# Patient Record
Sex: Male | Born: 1975 | Race: White | Hispanic: No | Marital: Married | Smoking: Never smoker
Health system: Southern US, Community
[De-identification: ages and names within clinical notes are randomized; demographics above are authoritative.]

---

## 2015-03-14 ENCOUNTER — Emergency Department
Admission: EM | Admit: 2015-03-14 | Discharge: 2015-03-14 | Disposition: A | Discharge status: Home / Self Care | Payer: PRIVATE HEALTH INSURANCE | Attending: Emergency Medicine | Admitting: Emergency Medicine

## 2015-03-14 ENCOUNTER — Encounter: Payer: Self-pay | Admitting: *Deleted

## 2015-03-14 ENCOUNTER — Other Ambulatory Visit: Payer: Self-pay

## 2015-03-14 ENCOUNTER — Emergency Department: Payer: PRIVATE HEALTH INSURANCE

## 2015-03-14 DIAGNOSIS — R002 Palpitations: Secondary | ICD-10-CM

## 2015-03-14 DIAGNOSIS — Z880 Allergy status to penicillin: Secondary | ICD-10-CM | POA: Insufficient documentation

## 2015-03-14 DIAGNOSIS — R1013 Epigastric pain: Secondary | ICD-10-CM | POA: Diagnosis not present

## 2015-03-14 DIAGNOSIS — R110 Nausea: Secondary | ICD-10-CM | POA: Insufficient documentation

## 2015-03-14 DIAGNOSIS — Z79899 Other long term (current) drug therapy: Secondary | ICD-10-CM | POA: Diagnosis not present

## 2015-03-14 DIAGNOSIS — R079 Chest pain, unspecified: Secondary | ICD-10-CM | POA: Diagnosis present

## 2015-03-14 LAB — TROPONIN I

## 2015-03-14 LAB — CBC
HCT: 47.6 % (ref 40.0–52.0)
HEMOGLOBIN: 15.9 g/dL (ref 13.0–18.0)
MCH: 31.1 pg (ref 26.0–34.0)
MCHC: 33.4 g/dL (ref 32.0–36.0)
MCV: 92.9 fL (ref 80.0–100.0)
PLATELETS: 193 10*3/uL (ref 150–440)
RBC: 5.12 MIL/uL (ref 4.40–5.90)
RDW: 12.2 % (ref 11.5–14.5)
WBC: 4.7 10*3/uL (ref 3.8–10.6)

## 2015-03-14 LAB — BASIC METABOLIC PANEL
ANION GAP: 8 (ref 5–15)
BUN: 16 mg/dL (ref 6–20)
CHLORIDE: 101 mmol/L (ref 101–111)
CO2: 30 mmol/L (ref 22–32)
CREATININE: 1.14 mg/dL (ref 0.61–1.24)
Calcium: 9.4 mg/dL (ref 8.9–10.3)
GFR calc non Af Amer: >60 mL/min (ref >60)
Glucose, Bld: 104 mg/dL — ABNORMAL HIGH (ref 65–99)
POTASSIUM: 4.1 mmol/L (ref 3.5–5.1)
SODIUM: 139 mmol/L (ref 135–145)

## 2015-03-14 LAB — TSH: TSH: 1.069 u[IU]/mL (ref 0.350–4.500)

## 2015-03-14 MED ORDER — ATENOLOL 50 MG PO TABS
50.0000 mg | ORAL_TABLET | Freq: Every day | ORAL | Status: DC
Start: 2015-03-14 — End: 2015-03-14

## 2015-03-14 NOTE — ED Notes (Signed)
Pt reports " heart fluttering", pt has been treated for hyplori 1 month ago, pt complains of dizziness when heart flutters

## 2015-03-14 NOTE — ED Notes (Signed)
Pt had couplet PVC run HR 48 , strip printed for MD, MD at bedside, pt states he feels nauseas and weak, will continue to monitor closely

## 2015-03-14 NOTE — ED Notes (Signed)
While in room pt stated he had "fluttering feeling", PVC on monitor

## 2015-03-14 NOTE — ED Provider Notes (Signed)
[facility] Emergency Department Provider Note  ____________________________________________  Time seen: Approximately 11:31 AM  I have reviewed the triage vital signs and the nursing notes.   HISTORY  Chief Complaint Chest Pain    HPI Mark Atkinson is a 39 y.o. male patient says he's had about 6 weeks' worth of some epigastric pain and was diagnosed and treated for H. pylori has been having some nausea. He also has for the last several days irregular heartbeat when it gets bad it makes him feel lightheaded. Patient reports he had this 6-7 years ago and went to see Dr. Serita Sheller TH,. He had a Holter monitor was told it was nothing really serious was told to watch his caffeine and stress it came back again as I said several days ago is been getting some lightheadedness with it. Last for long time feels like his heart is skipping beats he says it does not really seem to be associated with the epigastric pain. Patient denies any shortness of breath. He says he's got hot a couple times but not sweaty is sometimes nauseated. Patient reports he epigastric pain does not seem to be associated with eating or with exercise or really with anything else. The same is true for the lightheadedness and irregular heartbeat. I discussed the regular heartbeat with Dr. call wood who felt we should put him back on his atenolol HCTZ and have him follow-up with Dr. Lady Gary I agree with his plan completely patient will return if worse  History reviewed. No pertinent past medical history.  There are no active problems to display for this patient.   History reviewed. No pertinent past surgical history.  Current Outpatient Rx  Name  Route  Sig  Dispense  Refill  . Multiple Vitamin (MULTIVITAMIN WITH MINERALS) TABS tablet   Oral   Take 1 tablet by mouth daily.         . pantoprazole (PROTONIX) 40 MG tablet   Oral   Take 40 mg by mouth daily.      1     Allergies Penicillins  History  reviewed. No pertinent family history.  Social History Social History  Substance Use Topics  . Smoking status: Never Smoker   . Smokeless tobacco: None  . Alcohol Use: Yes    Review of Systems Constitutional: No fever/chills Eyes: No visual changes. ENT: No sore throat. Cardiovascular: Denies chest pain. Respiratory: Denies shortness of breath. Gastrointestinal: No abdominal pain.  No nausea, no vomiting.  No diarrhea.  No constipation. Genitourinary: Negative for dysuria. Musculoskeletal: Negative for back pain. Skin: Negative for rash. Neurological: Negative for headaches, focal weakness or numbness.  10-point ROS otherwise negative.  ____________________________________________   PHYSICAL EXAM:  VITAL SIGNS: ED Triage Vitals  Enc Vitals Group     BP 03/14/15 0852 124/87 mmHg     Pulse Rate 03/14/15 0852 54     Resp 03/14/15 0852 20     Temp 03/14/15 0852 97.9 F (36.6 C)     Temp Source 03/14/15 0852 Oral     SpO2 03/14/15 0852 99 %     Weight 03/14/15 0852 250 lb (113.399 kg)     Height 03/14/15 0852 6\' 7"  (2.007 m)     Head Cir --      Peak Flow --      Pain Score 03/14/15 0852 4     Pain Loc --      Pain Edu? --      Excl. in GC? --  Constitutional: Alert and oriented. Well appearing and in no acute distress. Eyes: Conjunctivae are normal. PERRL. EOMI. Head: Atraumatic. Nose: No congestion/rhinnorhea. Mouth/Throat: Mucous membranes are moist.  Oropharynx non-erythematous. Neck: No stridor.  Cardiovascular: Normal rate, regular rhythm. Grossly normal heart sounds.  Good peripheral circulation. Respiratory: Normal respiratory effort.  No retractions. Lungs CTAB. Gastrointestinal: Soft and nontender except for mild discomfort in the epigastric area patient indicates that this is the same type of discomfort that he has when he has his epigastric discomfort. No distention. No abdominal bruits. No CVA tenderness. }Musculoskeletal: No lower extremity  tenderness nor edema.  No joint effusions. Neurologic:  Normal speech and language. No gross focal neurologic deficits are appreciated. No gait instability. Skin:  Skin is warm, dry and intact. No rash noted. Psychiatric: Mood and affect are normal. Speech and behavior are normal.  ____________________________________________   LABS (all labs ordered are listed, but only abnormal results are displayed)  Labs Reviewed  BASIC METABOLIC PANEL - Abnormal; Notable for the following:    Glucose, Bld 104 (*)    All other components within normal limits  CBC  TROPONIN I  TSH   ____________________________________________  EKG  EKG read and interpreted by me shows sinus bradycardia at a rate of 51 normal axis normal ST T waves ____________________________________________  RADIOLOGY  Chest x-ray read by the radiologist and reviewed by myself is normal ____________________________________________   PROCEDURES    ____________________________________________   INITIAL IMPRESSION / ASSESSMENT AND PLAN / ED COURSE  Pertinent labs & imaging results that were available during my care of the patient were reviewed by me and considered in my medical decision making (see chart for details).   ____________________________________________   FINAL CLINICAL IMPRESSION(S) / ED DIAGNOSES  Final diagnoses:  Palpitations  Epigastric pain      Arnaldo Natal, MD 03/14/15 [phone]

## 2015-03-14 NOTE — ED Notes (Signed)
Pt states he felt some heart fluttering Sunday like his heart was skipping a beat, states he had same problem 7 years ago and wore a holter monitor for several days and stress test and HTN and pt was put on atenolo, pt states since then he has been off atenolol but last night he took 1 atenolol when he felt his heart flutter, pt also states recently he had some abd bloating and nausea and was diagnoised with h,pylori and put on abx and protonix

## 2016-08-14 IMAGING — CR DG CHEST 1V PORT
1 series · 1 of 1 positions shown · non-contrast
Comparison: None.

CLINICAL DATA: Acute onset substernal chest pain and tightness

EXAM:
PORTABLE CHEST - 1 VIEW

[ap]
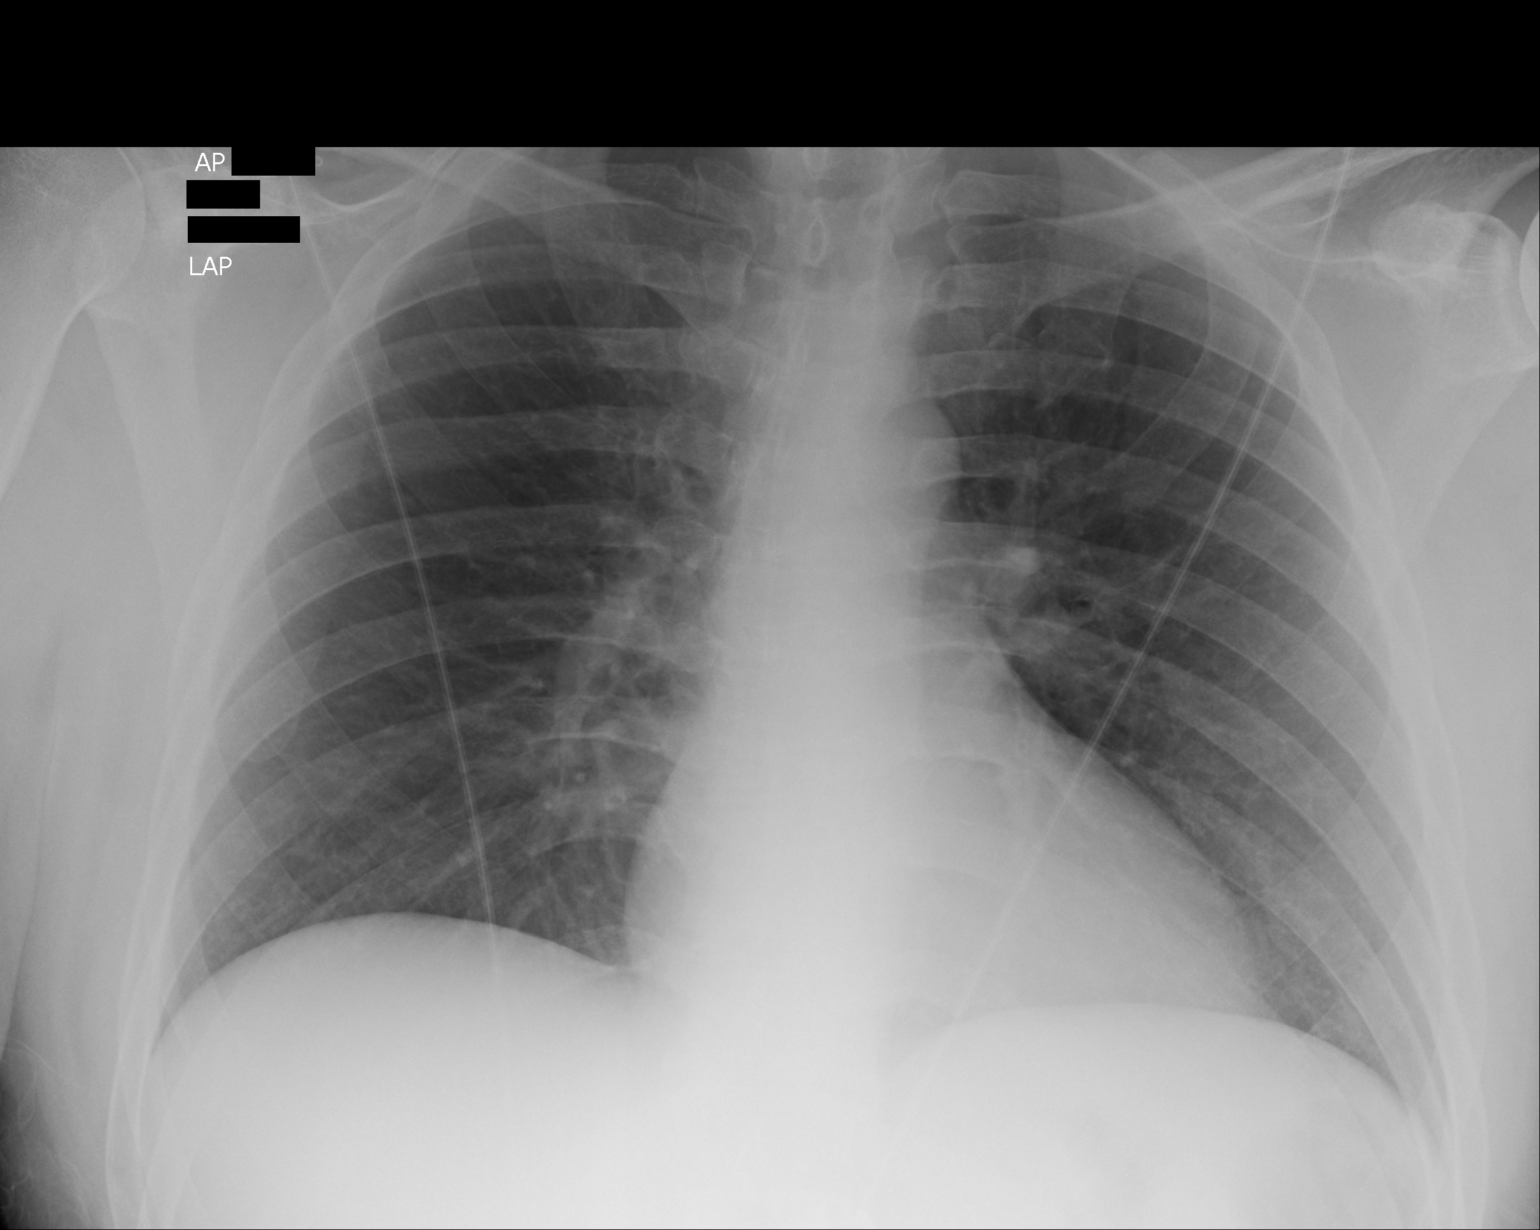

[1 of 1 positions shown; findings below may reference images not displayed]

FINDINGS: Lungs are clear. Heart size and pulmonary vascularity are normal. No
adenopathy. No pneumothorax. No bone lesions.
IMPRESSION: No abnormality noted.
